# Patient Record
Sex: Female | Born: 1965 | Race: Black or African American | Hispanic: No | Marital: Single | Smoking: Never smoker
Health system: Southern US, Community
[De-identification: ages and names within clinical notes are randomized; demographics above are authoritative.]

## PROBLEM LIST (undated history)

## (undated) DIAGNOSIS — I10 Essential (primary) hypertension: Secondary | ICD-10-CM

---

## 2009-11-29 ENCOUNTER — Emergency Department (HOSPITAL_BASED_OUTPATIENT_CLINIC_OR_DEPARTMENT_OTHER): Admission: EM | Admit: 2009-11-29 | Discharge: 2009-11-29 | Payer: Self-pay | Admitting: Emergency Medicine

## 2009-11-29 ENCOUNTER — Ambulatory Visit: Payer: Self-pay | Admitting: Diagnostic Radiology

## 2010-01-21 ENCOUNTER — Ambulatory Visit: Payer: Self-pay | Admitting: Diagnostic Radiology

## 2010-01-21 ENCOUNTER — Ambulatory Visit (HOSPITAL_BASED_OUTPATIENT_CLINIC_OR_DEPARTMENT_OTHER): Admission: RE | Admit: 2010-01-21 | Discharge: 2010-01-21 | Payer: Self-pay | Admitting: Orthopedic Surgery

## 2010-01-28 ENCOUNTER — Encounter: Admission: RE | Admit: 2010-01-28 | Discharge: 2010-02-25 | Payer: Self-pay | Admitting: Orthopedic Surgery

## 2010-03-23 ENCOUNTER — Ambulatory Visit: Payer: Self-pay | Admitting: Diagnostic Radiology

## 2010-03-23 ENCOUNTER — Ambulatory Visit (HOSPITAL_BASED_OUTPATIENT_CLINIC_OR_DEPARTMENT_OTHER): Admission: RE | Admit: 2010-03-23 | Discharge: 2010-03-23 | Payer: Self-pay | Admitting: Orthopedic Surgery

## 2010-08-06 ENCOUNTER — Emergency Department (INDEPENDENT_AMBULATORY_CARE_PROVIDER_SITE_OTHER): Payer: No Typology Code available for payment source

## 2010-08-06 ENCOUNTER — Emergency Department (HOSPITAL_BASED_OUTPATIENT_CLINIC_OR_DEPARTMENT_OTHER)
Admission: EM | Admit: 2010-08-06 | Discharge: 2010-08-06 | Disposition: A | Discharge status: Home / Self Care | Payer: No Typology Code available for payment source | Attending: Emergency Medicine | Admitting: Emergency Medicine

## 2010-08-06 DIAGNOSIS — I10 Essential (primary) hypertension: Secondary | ICD-10-CM | POA: Insufficient documentation

## 2010-08-06 DIAGNOSIS — M545 Low back pain, unspecified: Secondary | ICD-10-CM | POA: Insufficient documentation

## 2010-08-06 DIAGNOSIS — R109 Unspecified abdominal pain: Secondary | ICD-10-CM

## 2010-08-06 DIAGNOSIS — Z79899 Other long term (current) drug therapy: Secondary | ICD-10-CM | POA: Insufficient documentation

## 2010-08-06 DIAGNOSIS — Y929 Unspecified place or not applicable: Secondary | ICD-10-CM | POA: Insufficient documentation

## 2010-08-06 DIAGNOSIS — R10819 Abdominal tenderness, unspecified site: Secondary | ICD-10-CM | POA: Insufficient documentation

## 2010-08-06 MED ORDER — IOHEXOL 300 MG/ML  SOLN
100.0000 mL | Freq: Once | INTRAMUSCULAR | Status: AC | PRN
  Administered 2010-08-06: 100 mL via INTRAVENOUS

## 2011-03-19 ENCOUNTER — Emergency Department (HOSPITAL_BASED_OUTPATIENT_CLINIC_OR_DEPARTMENT_OTHER)
Admission: EM | Admit: 2011-03-19 | Discharge: 2011-03-19 | Disposition: A | Discharge status: Home / Self Care | Payer: Self-pay | Attending: Emergency Medicine | Admitting: Emergency Medicine

## 2011-03-19 DIAGNOSIS — N939 Abnormal uterine and vaginal bleeding, unspecified: Secondary | ICD-10-CM

## 2011-03-19 DIAGNOSIS — N898 Other specified noninflammatory disorders of vagina: Secondary | ICD-10-CM | POA: Insufficient documentation

## 2011-03-19 LAB — CBC
HCT: 37.1 % (ref 36.0–46.0)
MCHC: 34.2 g/dL (ref 30.0–36.0)
RDW: 13.4 % (ref 11.5–15.5)

## 2011-03-19 LAB — URINALYSIS, ROUTINE W REFLEX MICROSCOPIC
Ketones, ur: NEGATIVE mg/dL
Nitrite: NEGATIVE
Protein, ur: NEGATIVE mg/dL

## 2011-03-19 LAB — WET PREP, GENITAL

## 2011-03-19 LAB — URINE MICROSCOPIC-ADD ON

## 2011-03-19 LAB — PREGNANCY, URINE: Preg Test, Ur: NEGATIVE

## 2011-03-19 NOTE — ED Provider Notes (Signed)
History     CSN: [id number] Arrival date & time: 03/19/2011  8:13 PM  Chief Complaint  Patient presents with  . Vaginal Bleeding    (Consider location/radiation/quality/duration/timing/severity/associated sxs/prior treatment) HPI Comments: Pt states that 3 times today she has had large clots and she has bleed thru clothes and tampons  Patient is a 45 y.o. female presenting with vaginal bleeding. The history is provided by the patient. No language interpreter was used.  Vaginal Bleeding This is a new problem. The current episode started today. The problem occurs constantly. The problem has been unchanged. Associated symptoms include abdominal pain. Pertinent negatives include no diaphoresis, numbness or weakness. The symptoms are aggravated by nothing. She has tried nothing for the symptoms.    History reviewed. No pertinent past medical history.  History reviewed. No pertinent past surgical history.  No family history on file.  History  Substance Use Topics  . Smoking status: Never Smoker   . Smokeless tobacco: Not on file  . Alcohol Use: No    OB History    Grav Para Term Preterm Abortions TAB SAB Ect Mult Living                  Review of Systems  Constitutional: Negative for diaphoresis.  Gastrointestinal: Positive for abdominal pain.  Genitourinary: Positive for vaginal bleeding.  Neurological: Negative for weakness and numbness.  All other systems reviewed and are negative.    Allergies  Review of patient's allergies indicates not on file.  Home Medications  No current outpatient prescriptions on file.  BP 139/76  Pulse 74  Temp(Src) 97.9 F (36.6 C) (Oral)  Resp 18  Ht 5\' 9"  (1.753 m)  Wt 186 lb (84.369 kg)  BMI 27.47 kg/m2  SpO2 100%  LMP 03/17/2011  Physical Exam  Nursing note and vitals reviewed. Constitutional: She is oriented to person, place, and time. She appears well-developed and well-nourished.  HENT:  Head: Normocephalic.    Cardiovascular: Normal rate and regular rhythm.   Pulmonary/Chest: Effort normal and breath sounds normal.  Abdominal: Soft. Bowel sounds are normal.  Genitourinary: Cervix exhibits no motion tenderness. There is bleeding around the vagina.  Musculoskeletal: Normal range of motion.  Neurological: She is alert and oriented to person, place, and time.  Skin: Skin is warm.  Psychiatric: She has a normal mood and affect.    ED Course  Procedures (including critical care time)  Labs Reviewed  URINALYSIS, ROUTINE W REFLEX MICROSCOPIC - Abnormal; Notable for the following:    Color, Urine AMBER (*) BIOCHEMICALS MAY BE AFFECTED BY COLOR   Appearance CLOUDY (*)    Hgb urine dipstick LARGE (*)    Bilirubin Urine SMALL (*)    Leukocytes, UA TRACE (*)    All other components within normal limits  WET PREP, GENITAL - Abnormal; Notable for the following:    Clue Cells, Wet Prep FEW (*)    WBC, Wet Prep HPF POC RARE (*)    All other components within normal limits  PREGNANCY, URINE  CBC  URINE MICROSCOPIC-ADD ON  GC/CHLAMYDIA PROBE AMP, GENITAL   No results found.   No diagnosis found.    MDM  Pt is not pregnant and hgb is normal:will have pt follow up with obgyn for continued symptoms        Teressa Lower, NP 03/19/11 2127

## 2011-03-19 NOTE — ED Notes (Signed)
Sudden onset of vaginal bleeding with large clots-started 6pm

## 2011-03-19 NOTE — ED Provider Notes (Signed)
Medical screening examination/treatment/procedure(s) were performed by non-physician practitioner and as supervising physician I was immediately available for consultation/collaboration.   Lyanne Co, MD 03/19/11 [phone]

## 2011-03-20 LAB — GC/CHLAMYDIA PROBE AMP, GENITAL: Chlamydia, DNA Probe: NEGATIVE

## 2013-02-10 ENCOUNTER — Emergency Department (HOSPITAL_BASED_OUTPATIENT_CLINIC_OR_DEPARTMENT_OTHER)
Admission: EM | Admit: 2013-02-10 | Discharge: 2013-02-10 | Disposition: A | Discharge status: Home / Self Care | Payer: Self-pay | Attending: Emergency Medicine | Admitting: Emergency Medicine

## 2013-02-10 ENCOUNTER — Encounter (HOSPITAL_BASED_OUTPATIENT_CLINIC_OR_DEPARTMENT_OTHER): Payer: Self-pay | Admitting: Emergency Medicine

## 2013-02-10 DIAGNOSIS — L0231 Cutaneous abscess of buttock: Secondary | ICD-10-CM | POA: Insufficient documentation

## 2013-02-10 DIAGNOSIS — L539 Erythematous condition, unspecified: Secondary | ICD-10-CM | POA: Insufficient documentation

## 2013-02-10 DIAGNOSIS — Z79899 Other long term (current) drug therapy: Secondary | ICD-10-CM | POA: Insufficient documentation

## 2013-02-10 DIAGNOSIS — I10 Essential (primary) hypertension: Secondary | ICD-10-CM | POA: Insufficient documentation

## 2013-02-10 DIAGNOSIS — R21 Rash and other nonspecific skin eruption: Secondary | ICD-10-CM | POA: Insufficient documentation

## 2013-02-10 DIAGNOSIS — B958 Unspecified staphylococcus as the cause of diseases classified elsewhere: Secondary | ICD-10-CM | POA: Insufficient documentation

## 2013-02-10 HISTORY — DX: Essential (primary) hypertension: I10

## 2013-02-10 MED ORDER — DOXYCYCLINE HYCLATE 100 MG PO CAPS: 100.0000 mg | ORAL_CAPSULE | Freq: Two times a day (BID) | ORAL | Status: AC

## 2013-02-10 MED ORDER — LIDOCAINE HCL (PF) 1 % IJ SOLN
INTRAMUSCULAR | Status: AC
  Administered 2013-02-10: 2.1 mL
  Filled 2013-02-10: qty 5

## 2013-02-10 MED ORDER — CEPHALEXIN 500 MG PO CAPS: 500.0000 mg | ORAL_CAPSULE | Freq: Four times a day (QID) | ORAL | Status: AC

## 2013-02-10 MED ORDER — CEFTRIAXONE SODIUM 1 G IJ SOLR
1.0000 g | Freq: Once | INTRAMUSCULAR | Status: AC
  Administered 2013-02-10: 1 g via INTRAMUSCULAR
  Filled 2013-02-10: qty 10

## 2013-02-10 NOTE — ED Notes (Signed)
rx x 2 given for doxycycline and keflex

## 2013-02-10 NOTE — ED Notes (Signed)
Pt reports multiple "boils"  Coming up on various parts of her body for the past couple weeks. Has been treating with OTC with no change. No hx of same.

## 2013-02-10 NOTE — ED Provider Notes (Signed)
Medical screening examination/treatment/procedure(s) were performed by non-physician practitioner and as supervising physician I was immediately available for consultation/collaboration.   Antionne Enrique, MD 02/10/13 2347 

## 2013-02-10 NOTE — ED Provider Notes (Signed)
CSN: [id number]     Arrival date & time 02/10/13  2035 History   None    Chief Complaint  Patient presents with  . Abscess   (Consider location/radiation/quality/duration/timing/severity/associated sxs/prior Treatment) Patient is a 47 y.o. female presenting with rash. The history is provided by the patient. No language interpreter was used.  Rash Location:  Full body Quality: itchiness and redness   Severity:  Moderate Timing:  Constant Progression:  Worsening Chronicity:  New Context: insect bite/sting   Relieved by:  Nothing Worsened by:  Nothing tried Ineffective treatments:  Antibiotic cream and anti-itch cream Pt complains of multiple boils coming up all over body.   Pt reports boils leave dark spots  Past Medical History  Diagnosis Date  . Hypertension    History reviewed. No pertinent past surgical history. No family history on file. History  Substance Use Topics  . Smoking status: Never Smoker   . Smokeless tobacco: Not on file  . Alcohol Use: No   OB History   Grav Para Term Preterm Abortions TAB SAB Ect Mult Living                 Review of Systems  Skin: Positive for rash and wound.  All other systems reviewed and are negative.    Allergies  Sulfa antibiotics  Home Medications   Current Outpatient Rx  Name  Route  Sig  Dispense  Refill  . atenolol (TENORMIN) 25 MG tablet   Oral   Take 25 mg by mouth daily.           . Calcium Carbonate-Vit D-Min 1200-1000 MG-UNIT CHEW   Oral   Chew 2 tablets by mouth daily.           . cholecalciferol (VITAMIN D) 1000 UNITS tablet   Oral   Take 1,000 Units by mouth daily.           . ferrous sulfate 325 (65 FE) MG tablet   Oral   Take 325 mg by mouth daily with breakfast.           . fish oil-omega-3 fatty acids 1000 MG capsule   Oral   Take 1 g by mouth daily.           . Potassium 99 MG TABS   Oral   Take 1 tablet by mouth daily.           . vitamin C (ASCORBIC ACID) 500 MG tablet  Oral   Take 500 mg by mouth daily.           . vitamin E 400 UNIT capsule   Oral   Take 400 Units by mouth daily.            BP 173/99  Pulse 88  Temp(Src) 98.4 F (36.9 C) (Oral)  Resp 18  Ht 5\' 8"  (1.727 m)  Wt 198 lb (89.812 kg)  BMI 30.11 kg/m2  SpO2 100%  LMP 01/11/2013 Physical Exam  Nursing note and vitals reviewed. Constitutional: She is oriented to person, place, and time. She appears well-developed and well-nourished.  Cardiovascular: Normal rate.   Pulmonary/Chest: Effort normal.  Musculoskeletal: Normal range of motion.  Multiple pustules,  2cm red swollen area left buttock.  Neurological: She is alert and oriented to person, place, and time. She has normal reflexes.  Skin: There is erythema.  Psychiatric: She has a normal mood and affect.    ED Course  Procedures (including critical care time) Labs Review Labs Reviewed -  No data to display Imaging Review No results found.  MDM   1. Staph skin infection    Wound culture obtained,   I suspect staph,   Pt given rocephin and keflex and doxycycline.    Lonia Skinner [place], PA-C 02/10/13 2156

## 2013-02-14 LAB — WOUND CULTURE

## 2014-01-26 ENCOUNTER — Other Ambulatory Visit (HOSPITAL_BASED_OUTPATIENT_CLINIC_OR_DEPARTMENT_OTHER): Payer: Self-pay | Admitting: Podiatry

## 2014-01-26 DIAGNOSIS — R52 Pain, unspecified: Secondary | ICD-10-CM

## 2014-01-28 ENCOUNTER — Ambulatory Visit (HOSPITAL_BASED_OUTPATIENT_CLINIC_OR_DEPARTMENT_OTHER): Payer: No Typology Code available for payment source

## 2014-01-28 ENCOUNTER — Ambulatory Visit (HOSPITAL_BASED_OUTPATIENT_CLINIC_OR_DEPARTMENT_OTHER): Payer: Managed Care, Other (non HMO)

## 2016-05-17 ENCOUNTER — Emergency Department (HOSPITAL_BASED_OUTPATIENT_CLINIC_OR_DEPARTMENT_OTHER)
Admission: EM | Admit: 2016-05-17 | Discharge: 2016-05-17 | Disposition: A | Discharge status: Home / Self Care | Payer: Managed Care, Other (non HMO) | Attending: Emergency Medicine | Admitting: Emergency Medicine

## 2016-05-17 ENCOUNTER — Encounter (HOSPITAL_BASED_OUTPATIENT_CLINIC_OR_DEPARTMENT_OTHER): Payer: Self-pay | Admitting: Emergency Medicine

## 2016-05-17 ENCOUNTER — Emergency Department (HOSPITAL_BASED_OUTPATIENT_CLINIC_OR_DEPARTMENT_OTHER): Payer: Managed Care, Other (non HMO)

## 2016-05-17 DIAGNOSIS — I10 Essential (primary) hypertension: Secondary | ICD-10-CM | POA: Insufficient documentation

## 2016-05-17 DIAGNOSIS — Z79899 Other long term (current) drug therapy: Secondary | ICD-10-CM | POA: Insufficient documentation

## 2016-05-17 DIAGNOSIS — Y9339 Activity, other involving climbing, rappelling and jumping off: Secondary | ICD-10-CM | POA: Insufficient documentation

## 2016-05-17 DIAGNOSIS — S40022A Contusion of left upper arm, initial encounter: Secondary | ICD-10-CM | POA: Insufficient documentation

## 2016-05-17 DIAGNOSIS — W19XXXA Unspecified fall, initial encounter: Secondary | ICD-10-CM

## 2016-05-17 DIAGNOSIS — S62664A Nondisplaced fracture of distal phalanx of right ring finger, initial encounter for closed fracture: Secondary | ICD-10-CM | POA: Insufficient documentation

## 2016-05-17 DIAGNOSIS — S8391XA Sprain of unspecified site of right knee, initial encounter: Secondary | ICD-10-CM | POA: Insufficient documentation

## 2016-05-17 DIAGNOSIS — S8392XA Sprain of unspecified site of left knee, initial encounter: Secondary | ICD-10-CM | POA: Insufficient documentation

## 2016-05-17 DIAGNOSIS — Y92039 Unspecified place in apartment as the place of occurrence of the external cause: Secondary | ICD-10-CM | POA: Insufficient documentation

## 2016-05-17 DIAGNOSIS — M25461 Effusion, right knee: Secondary | ICD-10-CM

## 2016-05-17 DIAGNOSIS — Y998 Other external cause status: Secondary | ICD-10-CM | POA: Insufficient documentation

## 2016-05-17 DIAGNOSIS — W108XXA Fall (on) (from) other stairs and steps, initial encounter: Secondary | ICD-10-CM | POA: Insufficient documentation

## 2016-05-17 MED ORDER — IBUPROFEN 400 MG PO TABS
600.0000 mg | ORAL_TABLET | Freq: Once | ORAL | Status: AC
  Administered 2016-05-17: 600 mg via ORAL
  Filled 2016-05-17: qty 1

## 2016-05-17 NOTE — ED Provider Notes (Signed)
[facility] Provider Note   CSN: [id number] Arrival date & time: 05/17/16  1706  By signing my name below, I, Brenda Whitehead, attest that this documentation has been prepared under the direction and in the presence of Pricilla LovelessScott Razia Screws, MD. Electronically Signed: Valentino SaxonBianca Whitehead, ED Scribe. 05/17/16. 5:48 PM.  History   Chief Complaint Chief Complaint  Patient presents with  . Fall   The history is provided by the patient. No language interpreter was used.   HPI Comments: Brenda Whitehead is a 50 y.o. female who presents to the Emergency Department complaining of moderate, constant, lower bilateral extremity pain s/p fall that occurred two days ago. Pt states she was at her apartment complex going down the stairs and one of the steps gave out which caused her to fall forward, landing on both her knees. She states she attempted to grab onto the railings to prevent herself from falling. She denies head injury and LOC. Pt reports associated bilateral knee pain, left upper shoulder pain and right hand pain. She notes having having bruising to both of her knees. Pt also notes having bruising with tenderness to her upper left shoulder, and her left thighs. Pt states she jammed her right hand in one of the rail openings when she was falling which caused her to have pain accompanied with soreness to her right thumb and right ring finger. She notes taking ibuprofen for pain with minimal relief. No additional complaints at this time. She denies fever, CP, SOB.   Past Medical History:  Diagnosis Date  . Hypertension     There are no active problems to display for this patient.   History reviewed. No pertinent surgical history.  OB History    No data available       Home Medications    Prior to Admission medications   Medication Sig Start Date End Date Taking? Authorizing Provider  amLODipine (NORVASC) 2.5 MG tablet Take 2.5 mg by mouth daily.   Yes Historical Provider, MD    atenolol (TENORMIN) 25 MG tablet Take 25 mg by mouth daily.      Historical Provider, MD  Calcium Carbonate-Vit D-Min 1200-1000 MG-UNIT CHEW Chew 2 tablets by mouth daily.      Historical Provider, MD  cephALEXin (KEFLEX) 500 MG capsule Take 1 capsule (500 mg total) by mouth 4 (four) times daily. 02/10/13   Elson AreasLeslie K Sofia, PA-C  cholecalciferol (VITAMIN D) 1000 UNITS tablet Take 1,000 Units by mouth daily.      Historical Provider, MD  doxycycline (VIBRAMYCIN) 100 MG capsule Take 1 capsule (100 mg total) by mouth 2 (two) times daily. 02/10/13   Elson AreasLeslie K Sofia, PA-C  ferrous sulfate 325 (65 FE) MG tablet Take 325 mg by mouth daily with breakfast.      Historical Provider, MD  fish oil-omega-3 fatty acids 1000 MG capsule Take 1 g by mouth daily.      Historical Provider, MD  Potassium 99 MG TABS Take 1 tablet by mouth daily.      Historical Provider, MD  vitamin C (ASCORBIC ACID) 500 MG tablet Take 500 mg by mouth daily.      Historical Provider, MD  vitamin E 400 UNIT capsule Take 400 Units by mouth daily.      Historical Provider, MD    Family History History reviewed. No pertinent family history.  Social History Social History  Substance Use Topics  . Smoking status: Never Smoker  . Smokeless tobacco: Never Used  . Alcohol use  No     Allergies   Sulfa antibiotics   Review of Systems Review of Systems  Constitutional: Negative for fever.  Respiratory: Negative for shortness of breath.   Cardiovascular: Negative for chest pain.  Musculoskeletal: Positive for arthralgias (bilateral knees ) and joint swelling (bilateral kneees).       + bruising   Neurological: Negative for syncope.     Physical Exam Updated Vital Signs BP 179/90 (BP Location: Right Arm)   Pulse 77   Temp 97.6 F (36.4 C) (Oral)   Resp 18   Ht 5\' 9"  (1.753 m)   Wt 200 lb (90.7 kg)   LMP 04/18/2016   SpO2 100%   BMI 29.53 kg/m   Physical Exam  Constitutional: She is oriented to person, place, and time.  She appears well-developed and well-nourished.  HENT:  Head: Normocephalic and atraumatic.  Right Ear: External ear normal.  Left Ear: External ear normal.  Nose: Nose normal.  Eyes: Right eye exhibits no discharge. Left eye exhibits no discharge.  Cardiovascular: Normal rate, regular rhythm and normal heart sounds.   Pulmonary/Chest: Effort normal and breath sounds normal.  Abdominal: Soft. There is no tenderness.  Musculoskeletal:       Left shoulder: She exhibits normal range of motion and no tenderness.       Right hip: She exhibits normal range of motion and no tenderness.       Left hip: She exhibits normal range of motion and no tenderness.       Right knee: She exhibits swelling. She exhibits normal range of motion. Tenderness found.       Left knee: She exhibits swelling. She exhibits normal range of motion. Tenderness found.       Right ankle: She exhibits normal range of motion and no swelling. No tenderness.       Left ankle: She exhibits normal range of motion and no swelling. No tenderness.       Left upper arm: She exhibits tenderness (small bruise ).       Right hand: She exhibits tenderness.       Hands:      Right upper leg: She exhibits no tenderness.       Left upper leg: She exhibits no tenderness.       Right lower leg: She exhibits no tenderness.       Left lower leg: She exhibits no tenderness.       Right foot: There is tenderness. There is no swelling.       Left foot: There is tenderness. There is no swelling.  Neurological: She is alert and oriented to person, place, and time.  Skin: Skin is warm and dry.  Nursing note and vitals reviewed.    ED Treatments / Results   DIAGNOSTIC STUDIES: Oxygen Saturation is 100% on RA, normal by my interpretation.    COORDINATION OF CARE: 5:30 PM Discussed treatment plan with pt at bedside which includes XR of the left humerus, left foot, right foot, right knee, left knee, right hand and pt agreed to  plan.   Labs (all labs ordered are listed, but only abnormal results are displayed) Labs Reviewed - No data to display  EKG  EKG Interpretation None       Radiology Dg Knee Complete 4 Views Left  Result Date: 05/17/2016 CLINICAL DATA:  Left knee pain after injury. Fall down 2 stairs 2 days prior. EXAM: LEFT KNEE - COMPLETE 4+ VIEW COMPARISON:  None. FINDINGS:  No acute fracture or dislocation. Mild tricompartmental osteoarthritis with peripheral spurring. Mild joint space narrowing of the patellofemoral and medial tibiofemoral spaces. Trace joint effusion. IMPRESSION: 1. No acute fracture or subluxation. 2. Mild tricompartmental osteoarthritis. Electronically Signed   By: Rubye Oaks M.D.   On: 05/17/2016 18:22   Dg Knee Complete 4 Views Right  Result Date: 05/17/2016 CLINICAL DATA:  Larey Seat down 2 stairs 2 days ago. Bruising to posterior aspect of left proximal humerus; no pain. bilat anterior knee pain. bilat foot pain, metatarsal areas. Right hand pain, esp thumb and palmar surface. EXAM: RIGHT KNEE - COMPLETE 4+ VIEW COMPARISON:  None. FINDINGS: Large joint effusion is present. Mild degenerative changes are identified involving medial, lateral, and patellofemoral compartments. No acute fracture or subluxation. IMPRESSION: Large joint effusion.  No acute fracture. Electronically Signed   By: Norva Pavlov M.D.   On: 05/17/2016 18:23   Dg Humerus Left  Result Date: 05/17/2016 CLINICAL DATA:  Pain after trauma EXAM: LEFT HUMERUS - 2+ VIEW COMPARISON:  None. FINDINGS: There is no evidence of fracture or other focal bone lesions. Soft tissues are unremarkable. IMPRESSION: Negative. Electronically Signed   By: Gerome Sam III M.D   On: 05/17/2016 18:26   Dg Hand Complete Right  Result Date: 05/17/2016 CLINICAL DATA:  Right hand pain. EXAM: RIGHT HAND - COMPLETE 3+ VIEW COMPARISON:  None. FINDINGS: There is a defect along the proximal ulnar aspect of the distal fourth phalanx  consistent with age indeterminate fracture. No soft tissue swelling in this region. No other abnormalities. IMPRESSION: Defect along the proximal ulnar aspect the distal fourth phalanx without soft tissue swelling. This is consistent with an age indeterminate fracture. Recommend clinical correlation for pain in this region. Electronically Signed   By: Gerome Sam III M.D   On: 05/17/2016 18:25   Dg Foot Complete Left  Result Date: 05/17/2016 CLINICAL DATA:  Left foot pain after injury. Fall down 2 stairs 2 days prior. EXAM: LEFT FOOT - COMPLETE 3+ VIEW COMPARISON:  None. FINDINGS: There is no evidence of fracture or dislocation. Minimal osteoarthritis at the first metatarsal phalangeal joint. Tiny plantar calcaneal spur. No evidence of inflammatory arthropathy. Soft tissues are unremarkable. IMPRESSION: No fracture or subluxation of the left foot. Electronically Signed   By: Rubye Oaks M.D.   On: 05/17/2016 18:22   Dg Foot Complete Right  Result Date: 05/17/2016 CLINICAL DATA:  Larey Seat down 2 stairs 2 days ago. Bruising to posterior aspect of left proximal humerus; no pain. bilat anterior knee pain. bilat foot pain, metatarsal areas. Right hand pain, esp thumb and palmar surface. EXAM: RIGHT FOOT COMPLETE - 3+ VIEW COMPARISON:  None. FINDINGS: Mild hallux valgus deformity. No acute fracture or subluxation. Soft tissues are unremarkable. IMPRESSION: No evidence for acute  abnormality. Electronically Signed   By: Norva Pavlov M.D.   On: 05/17/2016 18:21    Procedures Procedures (including critical care time)  Medications Ordered in ED Medications  ibuprofen (ADVIL,MOTRIN) tablet 600 mg (600 mg Oral Given 05/17/16 1740)     Initial Impression / Assessment and Plan / ED Course  I have reviewed the triage vital signs and the nursing notes.  Pertinent labs & imaging results that were available during my care of the patient were reviewed by me and considered in my medical decision making  (see chart for details).  Clinical Course as of May 17 1902  Sat May 17, 2016  1733 Likely has mostly bruises/sprains. Will xray affected extremities, ibuprofen  for pain. NV intact  [SG]    Clinical Course User Index [SG] Pricilla LovelessScott Cyntha Brickman, MD    Patient's x-rays show possible distal phalanx fracture of right ring finger. She does feel like she is most tender there, we'll place in a finger splint. Her other x-rays show no bony abnormalities. She does have an effusion in her right knee. On reexamination there is no warmth or erythema. I think this is from a sprain. She will be placed in bilateral knee sleeves. She is ambulating without difficulty. Neurovascularly intact. Follow-up with Dr. Pearletha ForgeHudnall, ibuprofen, and use ice and elevation. Discussed return precautions.  Final Clinical Impressions(s) / ED Diagnoses   Final diagnoses:  Fall, initial encounter  Closed nondisplaced fracture of distal phalanx of right ring finger, initial encounter  Knee effusion, right  Knee sprain, bilateral  Traumatic ecchymosis of left upper arm, initial encounter    New Prescriptions New Prescriptions   No medications on file   I personally performed the services described in this documentation, which was scribed in my presence. The recorded information has been reviewed and is accurate.     Pricilla LovelessScott Jennavieve Arrick, MD 05/17/16 [phone]

## 2016-05-17 NOTE — ED Triage Notes (Signed)
Patient states that she fell down about 6 steps 2 -3 days ago. Patient reports that right hand is hurting, and bruising to her left elbow and bilateral knees with other bruises noted by the patient

## 2016-05-17 NOTE — ED Notes (Signed)
Patient transported to X-ray 

## 2018-05-12 IMAGING — DX DG FOOT COMPLETE 3+V*L*
3 series · 3 of 3 positions shown · non-contrast
Comparison: None.

CLINICAL DATA: Left foot pain after injury. Fall down 2 stairs 2
days prior.

EXAM:
LEFT FOOT - COMPLETE 3+ VIEW

[foot ap]
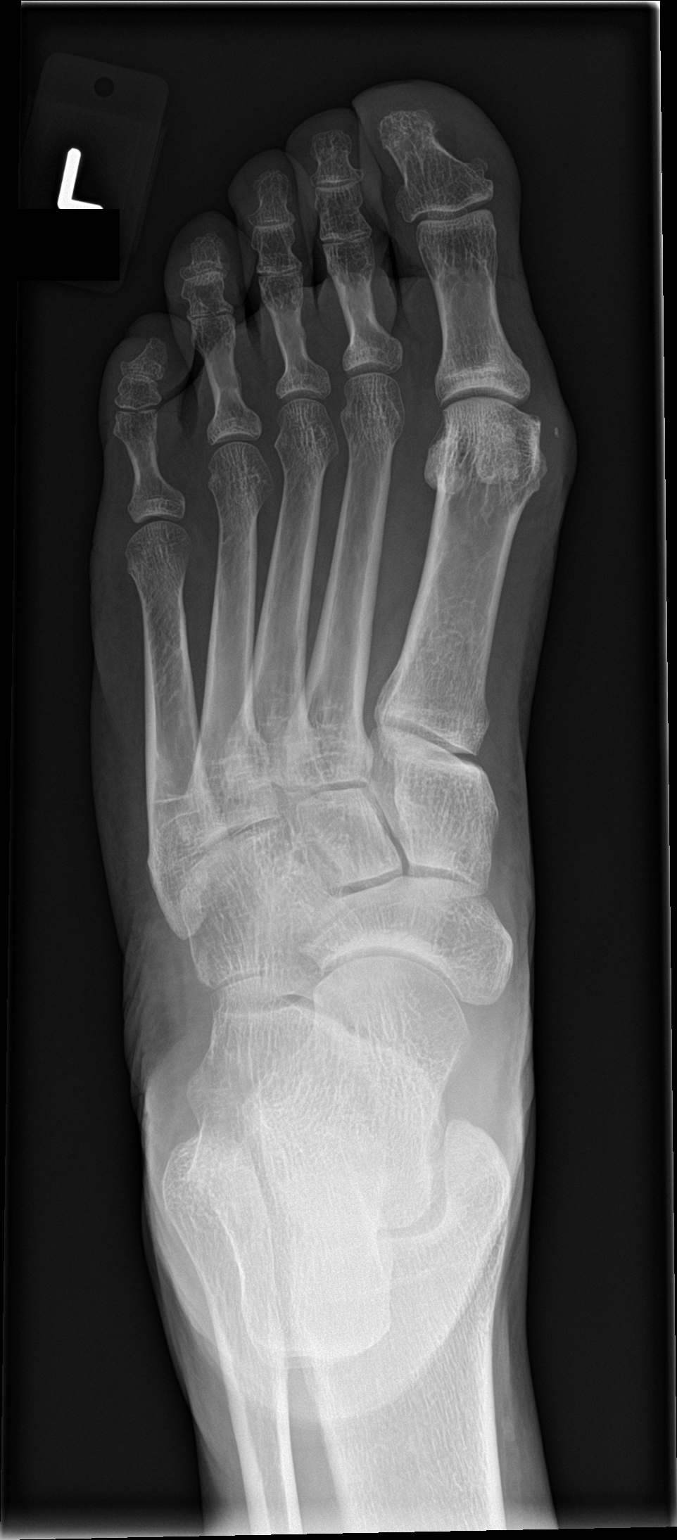

[foot obl]
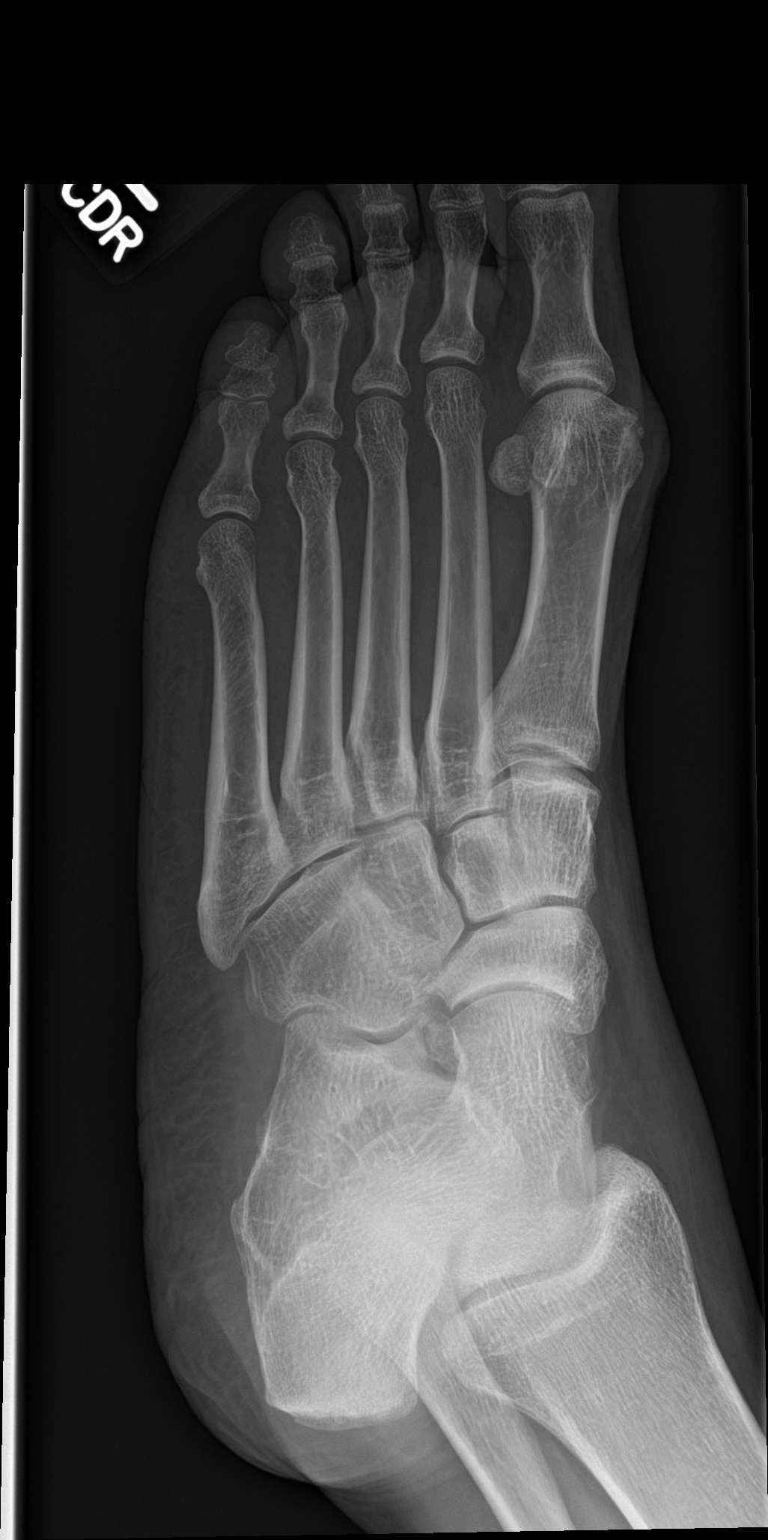

[foot lat]
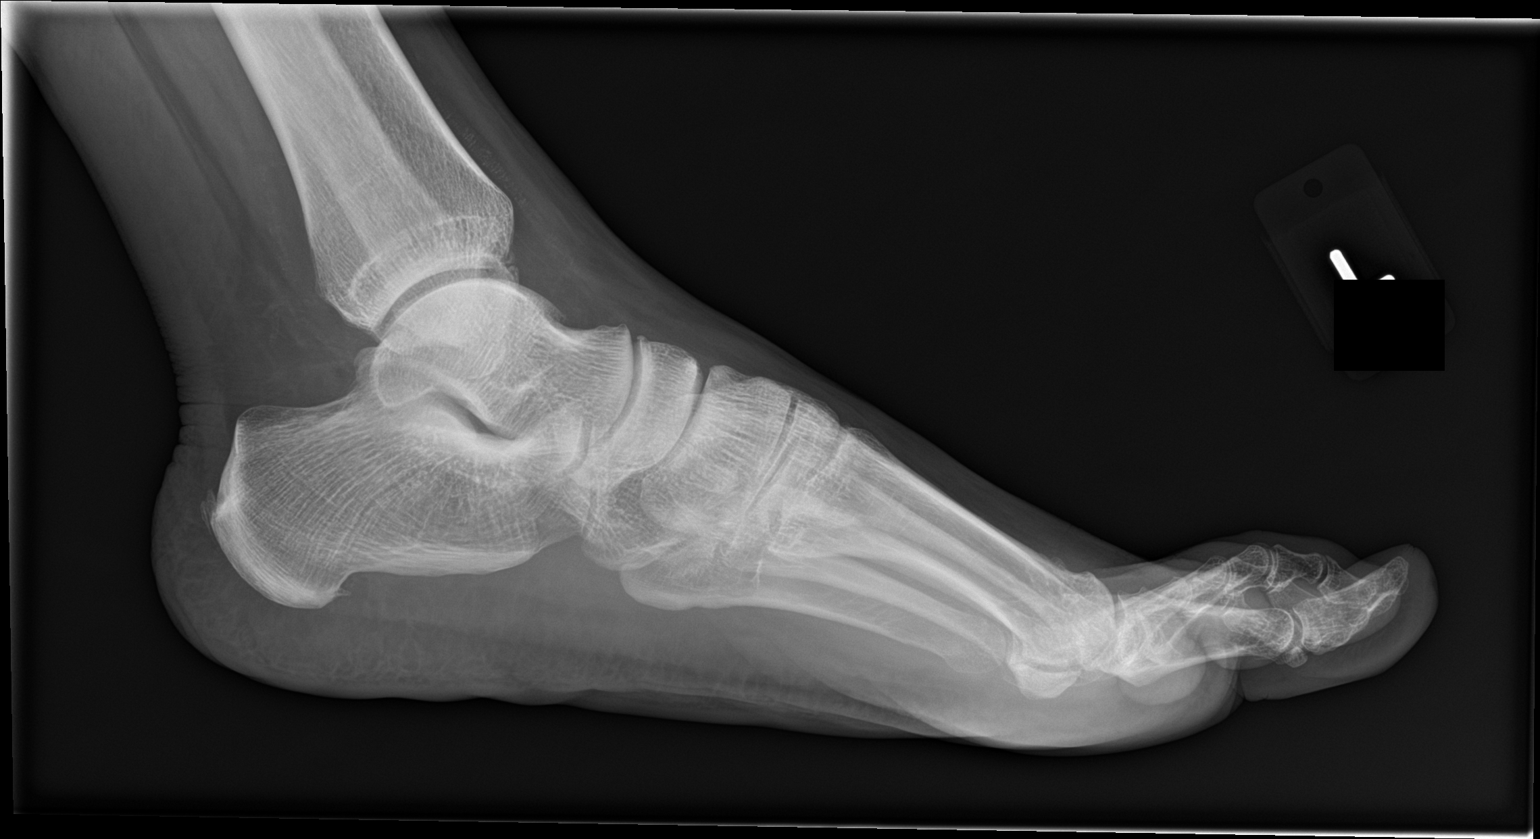

[3 of 3 positions shown; findings below may reference images not displayed]

FINDINGS: There is no evidence of fracture or dislocation. Minimal
osteoarthritis at the first metatarsal phalangeal joint. Tiny
plantar calcaneal spur. No evidence of inflammatory arthropathy.
Soft tissues are unremarkable.
IMPRESSION: No fracture or subluxation of the left foot.
